# Patient Record
Sex: Male | Born: 1965 | Race: White | Hispanic: No | Marital: Married | Smoking: Never smoker
Health system: Southern US, Community
[De-identification: ages and names within clinical notes are randomized; demographics above are authoritative.]

## PROBLEM LIST (undated history)

## (undated) DIAGNOSIS — K219 Gastro-esophageal reflux disease without esophagitis: Secondary | ICD-10-CM

## (undated) DIAGNOSIS — I10 Essential (primary) hypertension: Secondary | ICD-10-CM

## (undated) DIAGNOSIS — M1990 Unspecified osteoarthritis, unspecified site: Secondary | ICD-10-CM

## (undated) DIAGNOSIS — M519 Unspecified thoracic, thoracolumbar and lumbosacral intervertebral disc disorder: Secondary | ICD-10-CM

## (undated) HISTORY — PX: HAND SURGERY: SHX662

## (undated) HISTORY — PX: VASECTOMY: SHX75

## (undated) HISTORY — DX: Unspecified thoracic, thoracolumbar and lumbosacral intervertebral disc disorder: M51.9

## (undated) HISTORY — DX: Essential (primary) hypertension: I10

## (undated) HISTORY — DX: Gastro-esophageal reflux disease without esophagitis: K21.9

## (undated) HISTORY — DX: Unspecified osteoarthritis, unspecified site: M19.90

## (undated) HISTORY — PX: SHOULDER ARTHROSCOPY: SHX128

## (undated) HISTORY — PX: THORACIC DISC SURGERY: SHX801

---

## 2004-07-19 ENCOUNTER — Emergency Department (HOSPITAL_COMMUNITY): Admission: EM | Admit: 2004-07-19 | Discharge: 2004-07-20 | Payer: Self-pay | Admitting: Emergency Medicine

## 2005-08-27 IMAGING — CR DG HAND COMPLETE 3+V*R*
3 series · 3 of 3 positions shown · non-contrast
Comparison: none

CLINICAL DATA: Injured hand tonight, with swelling.
 RIGHT HAND
 Three views of the right hand were obtained.
 There is a slightly angulated fracture of the right second metacarpal head and neck with the distal portion angulated toward the palm.  No other acute bony abnormality is seen.
 IMPRESSION 
 Comminuted slightly angulated fracture of the right second metacarpal head and neck.

[view not recorded (1 of 3)]
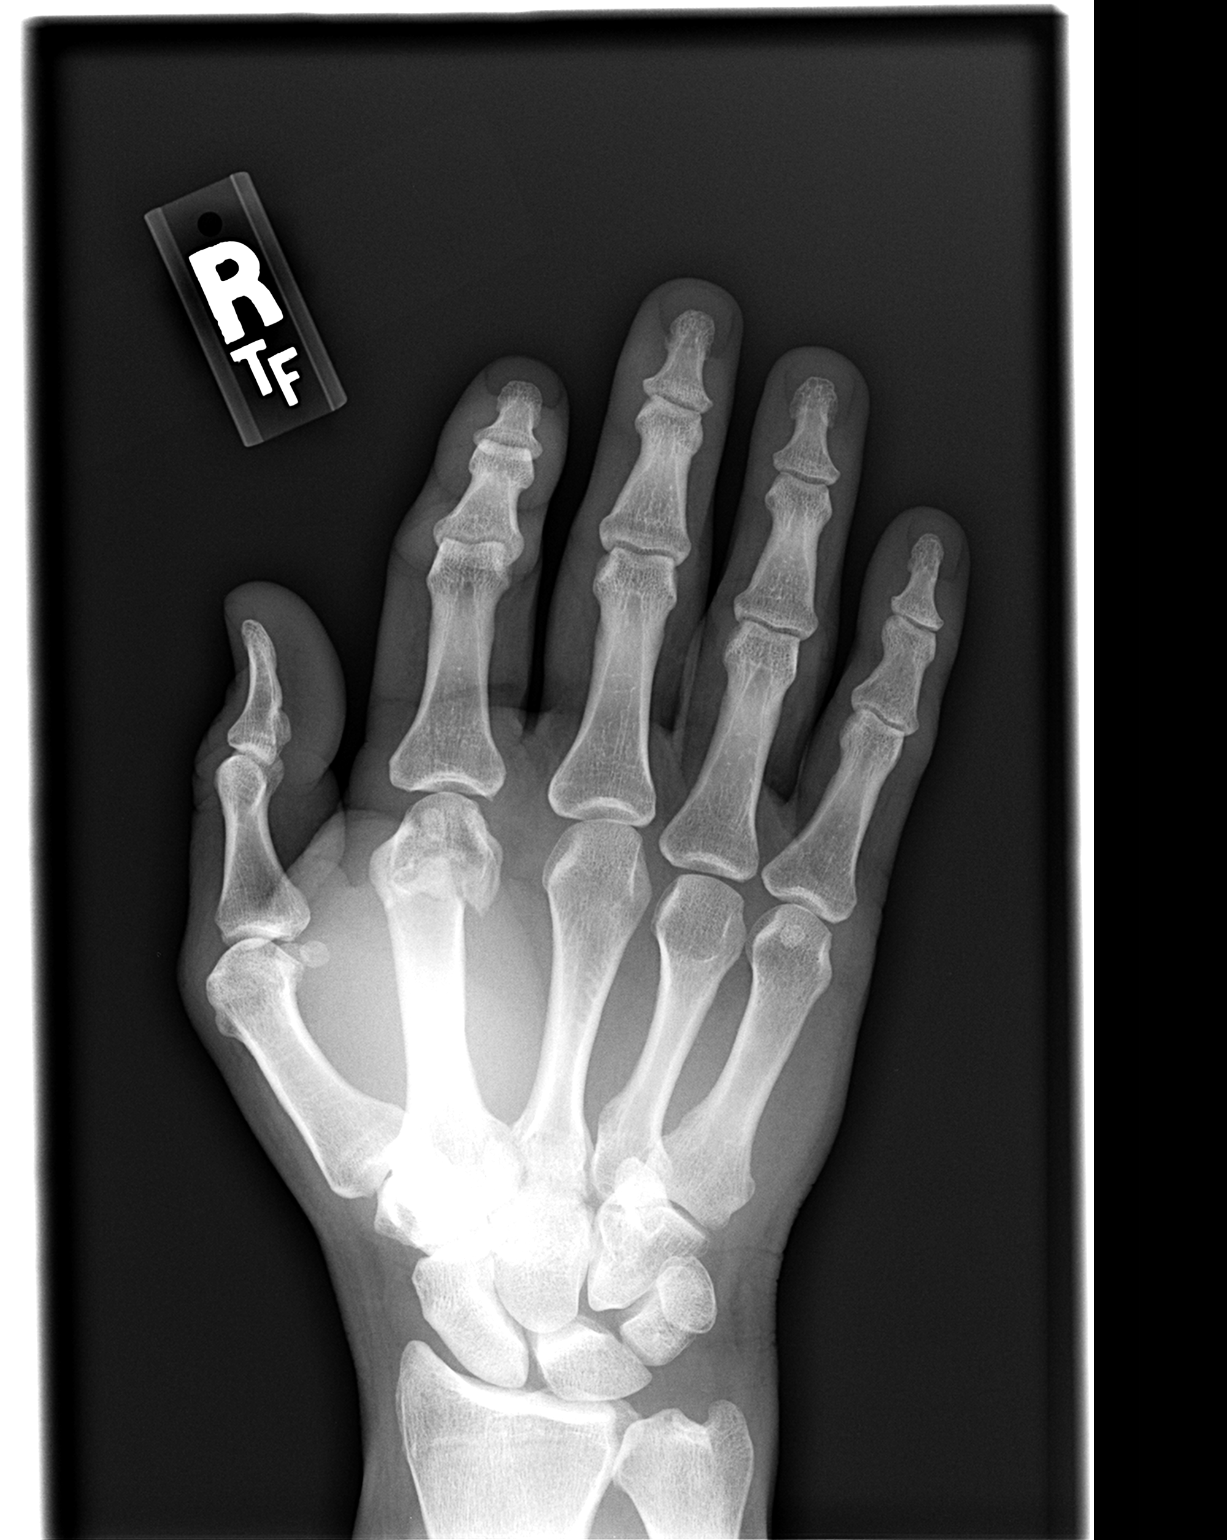

[view not recorded (2 of 3)]
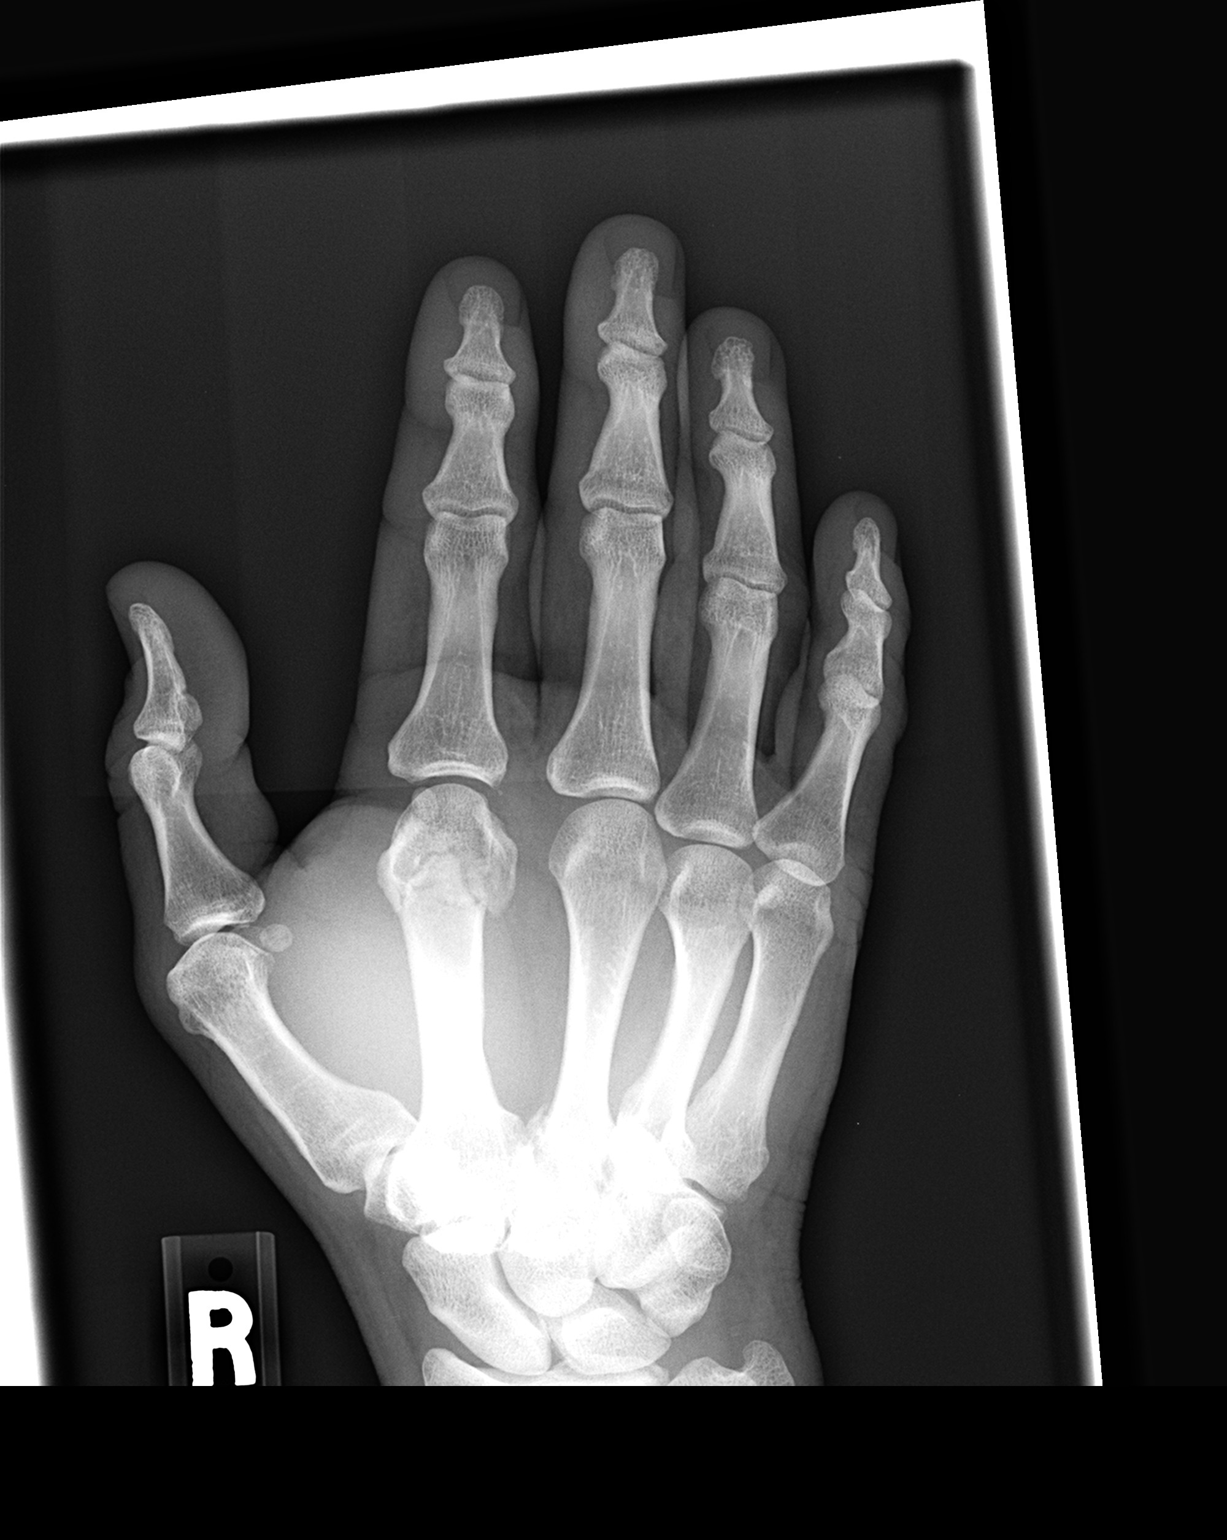

[view not recorded (3 of 3)]
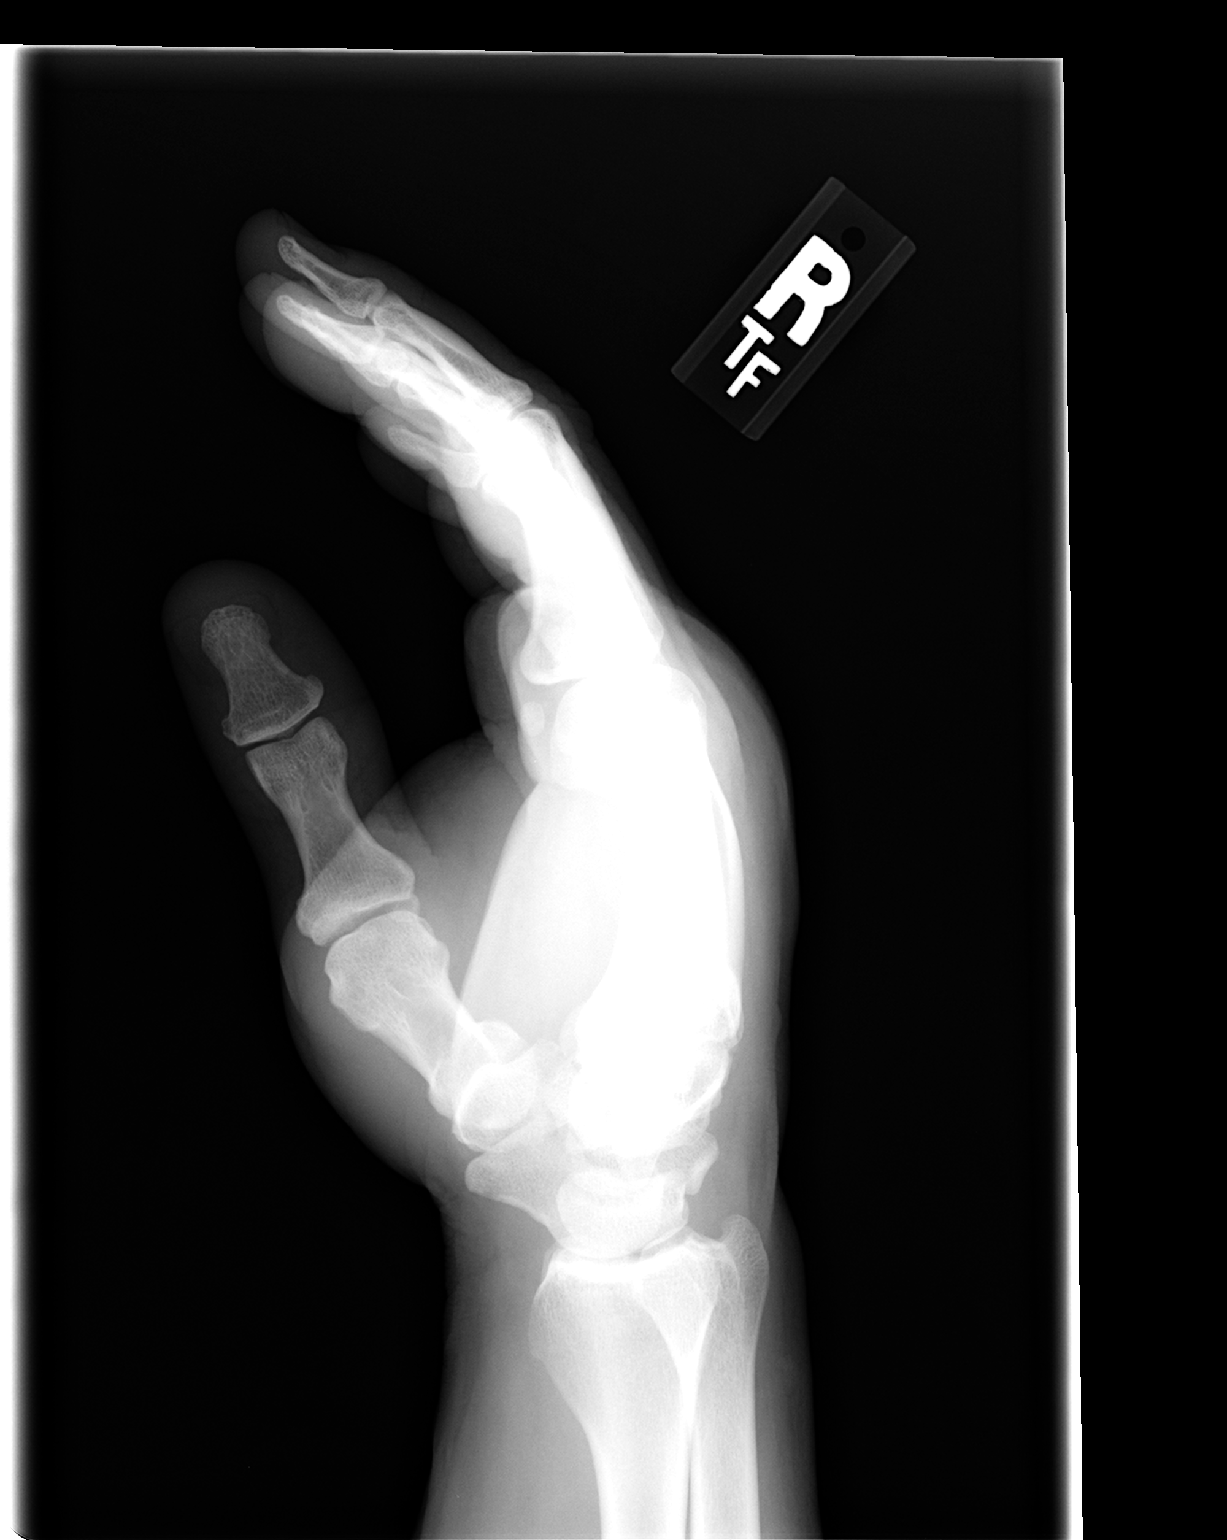

[3 of 3 positions shown; findings below may reference images not displayed]

## 2006-10-28 ENCOUNTER — Emergency Department (HOSPITAL_COMMUNITY): Admission: EM | Admit: 2006-10-28 | Discharge: 2006-10-28 | Payer: Self-pay | Admitting: Emergency Medicine

## 2008-08-30 ENCOUNTER — Encounter: Admission: RE | Admit: 2008-08-30 | Discharge: 2008-10-18 | Payer: Self-pay | Admitting: Orthopedic Surgery

## 2017-01-01 ENCOUNTER — Ambulatory Visit: Payer: Self-pay | Admitting: Cardiovascular Disease

## 2018-10-06 ENCOUNTER — Other Ambulatory Visit: Payer: Self-pay | Admitting: Family Medicine

## 2018-10-06 DIAGNOSIS — M25511 Pain in right shoulder: Secondary | ICD-10-CM

## 2018-10-06 DIAGNOSIS — G8929 Other chronic pain: Secondary | ICD-10-CM

## 2018-10-06 DIAGNOSIS — M546 Pain in thoracic spine: Principal | ICD-10-CM

## 2018-10-21 ENCOUNTER — Ambulatory Visit
Admission: RE | Admit: 2018-10-21 | Discharge: 2018-10-21 | Disposition: A | Discharge status: Home / Self Care | Payer: Commercial Managed Care - PPO | Source: Ambulatory Visit | Attending: Family Medicine | Admitting: Family Medicine

## 2018-10-21 DIAGNOSIS — G8929 Other chronic pain: Secondary | ICD-10-CM

## 2018-10-21 DIAGNOSIS — M546 Pain in thoracic spine: Principal | ICD-10-CM

## 2018-10-21 DIAGNOSIS — M25511 Pain in right shoulder: Secondary | ICD-10-CM

## 2019-11-29 IMAGING — MR MR THORACIC SPINE W/O CM
4 of 5 series · 16 of 48 positions shown · non-contrast
Comparison: Chest CT [HOSPITAL] Kai Ove Rasdal 10/21/2017.

CLINICAL DATA: 52-year-old male with chronic right-sided thoracic
back pain. Pain radiating down the spine to the buttock, hip and
leg.

EXAM:
MRI THORACIC SPINE WITHOUT CONTRAST
TECHNIQUE: Multiplanar, multisequence MR imaging of the thoracic spine was
performed. No intravenous contrast was administered.

[Series 6: STIR · sagittal · 3.5mm · 1.06mm/px · 3 of 12 slices shown]
[im 3/12]
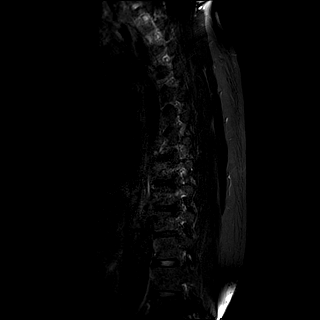
[im 7/12]
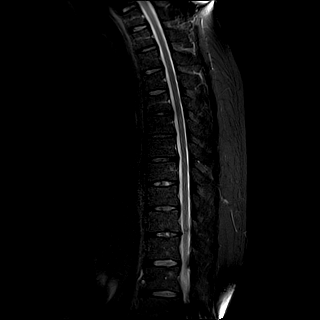
[im 12/12]
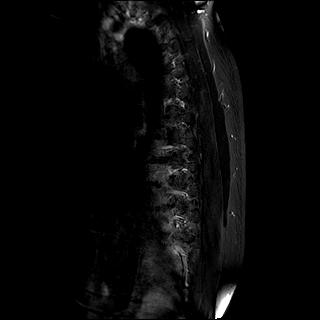

[Series 7: T1 · sagittal · 3.5mm · 0.53mm/px · 3 of 12 slices shown]
[im 1/12]
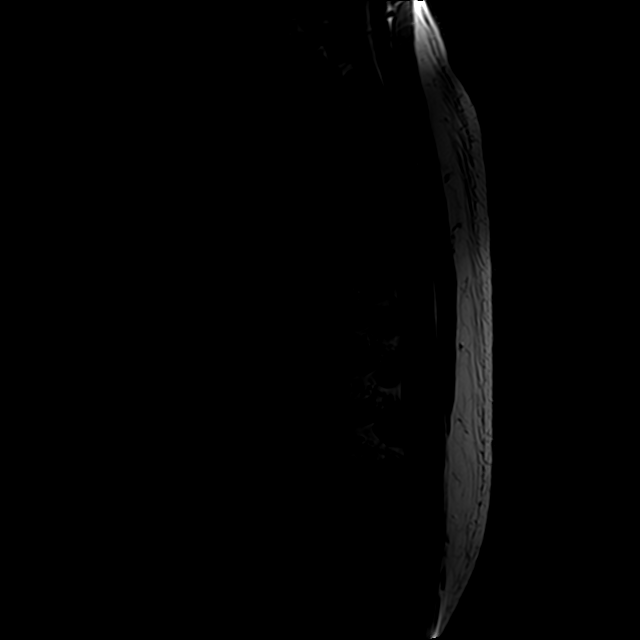
[im 6/12]
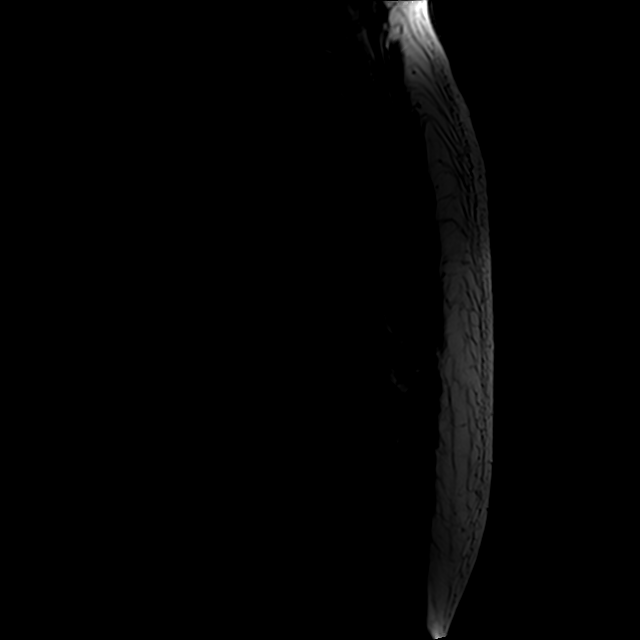
[im 12/12]
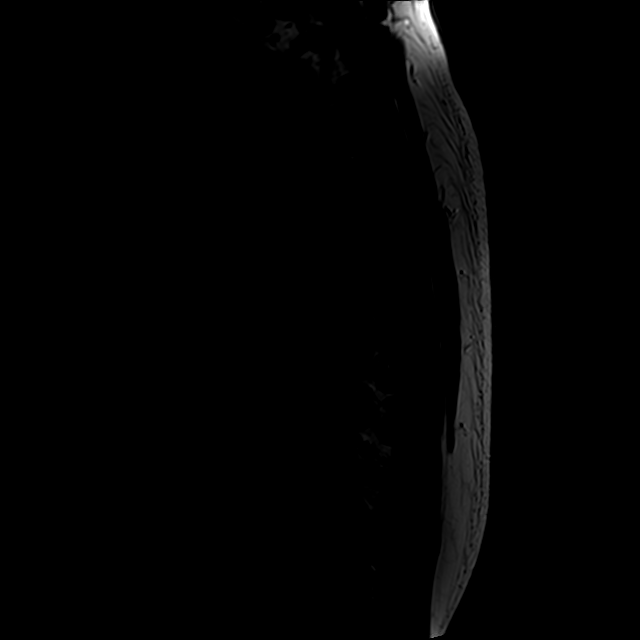

[Series 8: T2 · sagittal · 3.5mm · 0.53mm/px · 5 of 12 slices shown (1 of 2)]
[im 1/12]
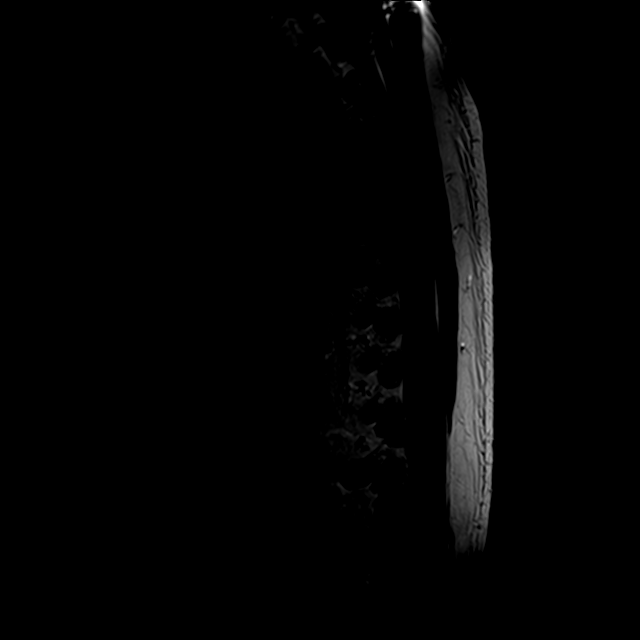
[im 3/12]
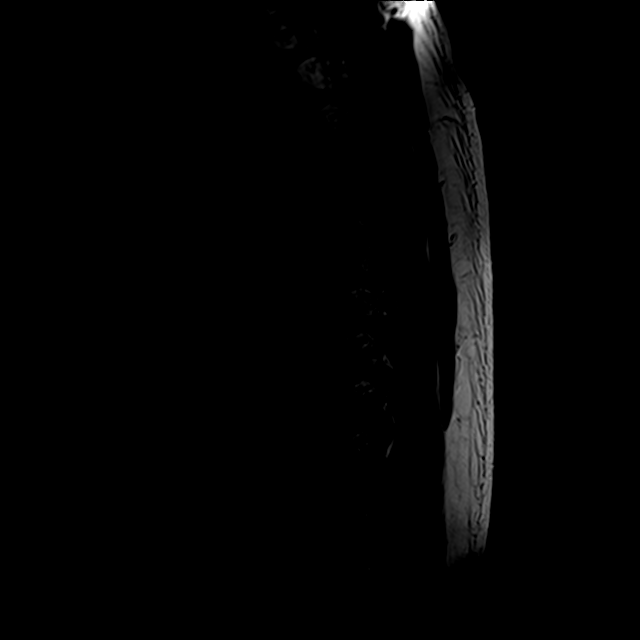
[im 6/12]
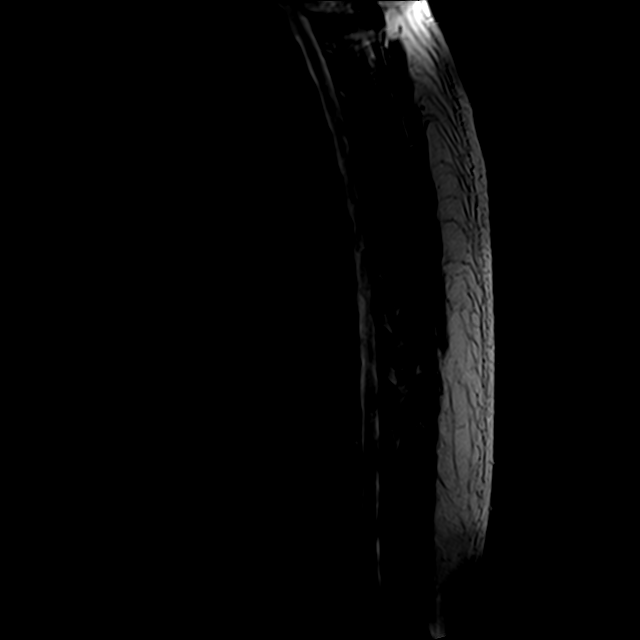
[im 9/12]
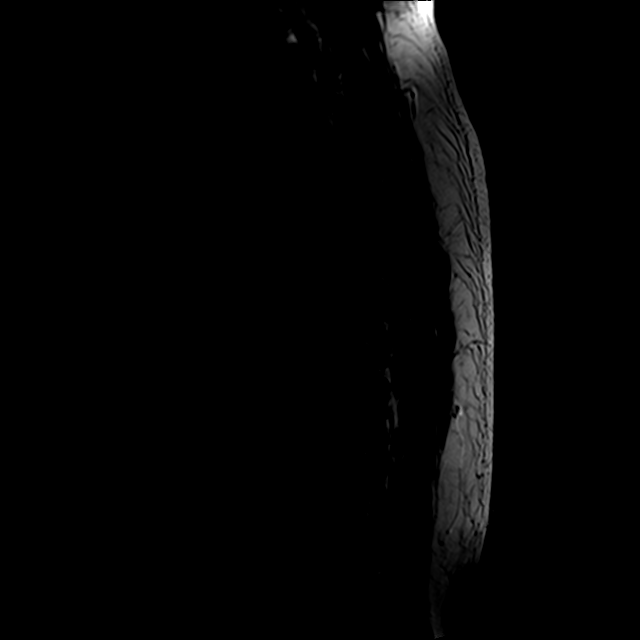
[im 12/12]
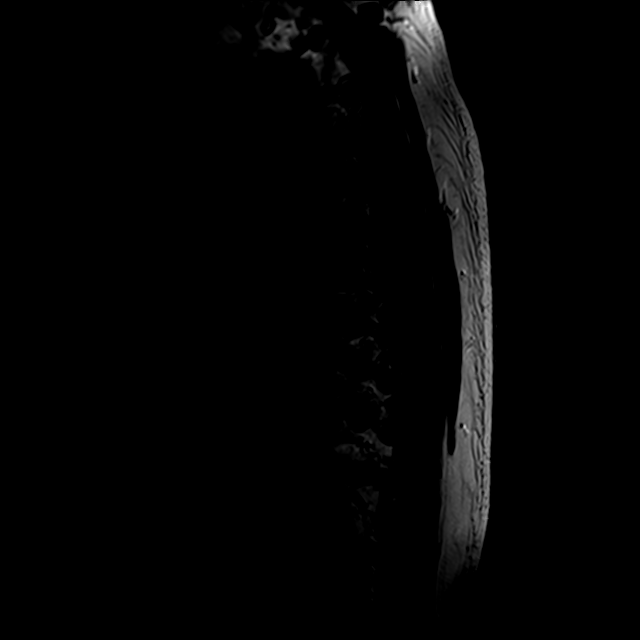

[Series 9: T2 · axial · 4.0mm · 0.39mm/px · z∈[-303,-99]mm · 5 of 36 slices shown (2 of 2)]
[im 3/36]
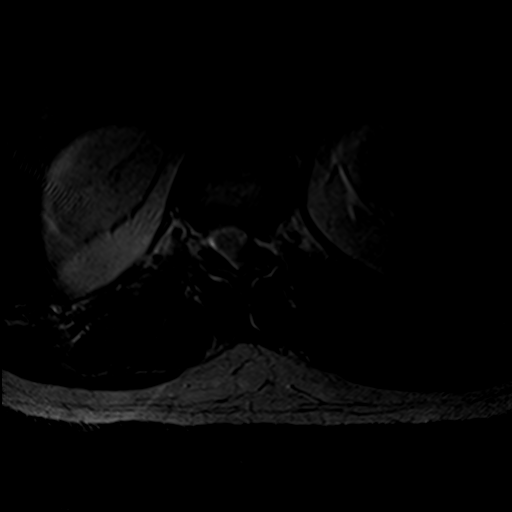
[im 5/36]
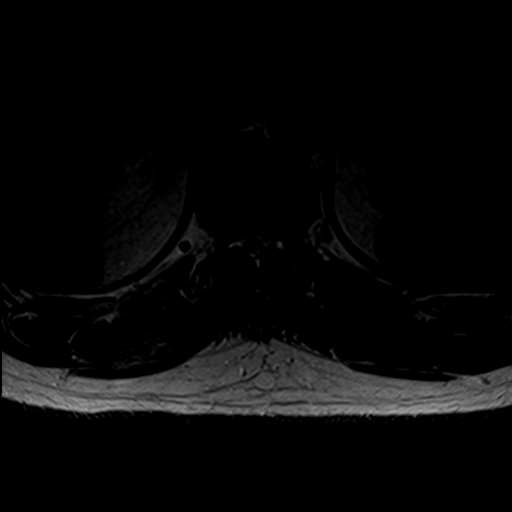
[im 8/36]
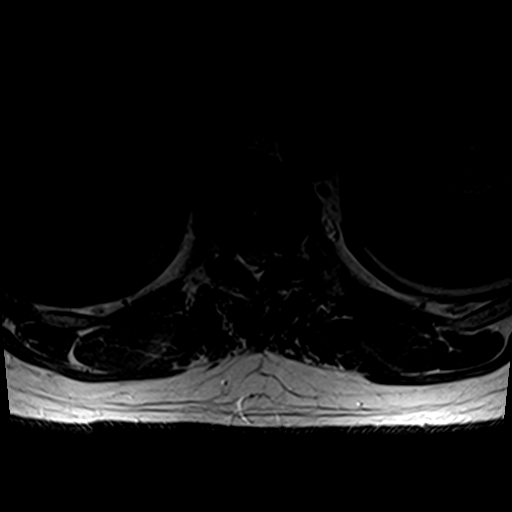
[im 19/36]
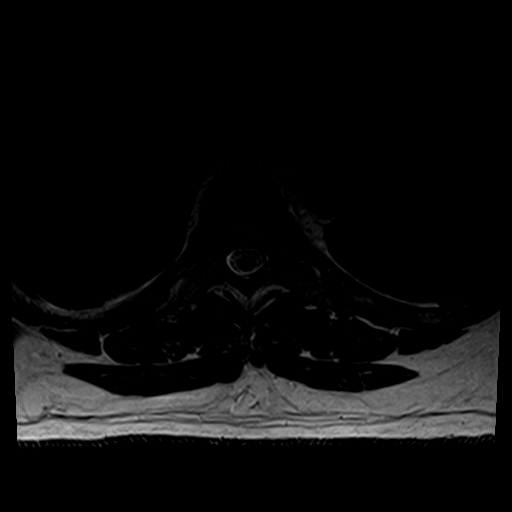
[im 31/36]
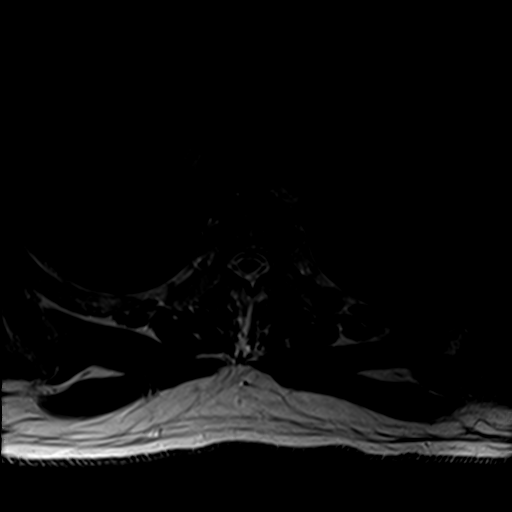

[16 of 48 positions shown; findings below may reference images not displayed]

FINDINGS: Limited cervical spine imaging:  Negative.

Thoracic spine segmentation:  Appears normal.

Alignment: Stable since 3237 with mild straightening of thoracic
kyphosis.

Vertebrae: No marrow edema or evidence of acute osseous abnormality.
A small round sclerotic lesion in the right T4 vertebral body seen
in 3237 has decreased T1 signal, but no marrow edema and is stable
in size. Favor benign etiology. Other visualized bone marrow signal
is within normal limits. There are mild chronic superior endplate
deformities of T4 and T5.

Cord: Spinal cord signal is within normal limits at all visualized
levels. Conus medullaris occurs below T12-L1.

Paraspinal and other soft tissues: Negative.

Disc levels:

T1-T2: Negative.

T2-T3: Negative.

T3-T4: Negative.

T4-T5: Mild to moderate facet hypertrophy. Borderline to mild
bilateral T4 foraminal stenosis. No spinal stenosis.

T5-T6: Mild left disc bulge or small left paracentral disc
protrusion. Moderate right facet hypertrophy. No spinal stenosis.
Moderate right T5 foraminal stenosis.

T6-T7: Negative.

T7-T8: Mild posterior disc bulging. Possible small right paracentral
annular fissure on series 9, image 20. No stenosis.

T8-T9: Negative.

T9-T10: Mild disc bulge. Mild to moderate facet hypertrophy greater
on the left. Moderate to severe left and mild right T9 foraminal
stenosis.

T10-T11: Circumferential disc bulge. Moderate facet hypertrophy.
Mild spinal stenosis. Mild cord mass effect. Moderate to severe
bilateral T10 foraminal stenosis.

T11-T12: Moderate facet and moderate to severe ligament flavum
hypertrophy. Mild spinal stenosis. Mild right T11 foraminal
stenosis.

T12-L1: Negative.
IMPRESSION: 1. No acute osseous abnormality. A small benign bone lesion is
suspected in the right T4 vertebral body and appears stable since a
Chest CT 1 year ago.
2. Multifactorial degenerative lower thoracic spinal stenosis at
T10-T11 and T11-T12. Up to mild spinal cord mass effect but no cord
signal abnormality.
3. Associated degenerative moderate or severe neural foraminal
stenosis at the bilateral T10 nerve levels, and also the right T5
and T9 nerve levels.

## 2022-06-11 DIAGNOSIS — R072 Precordial pain: Secondary | ICD-10-CM | POA: Insufficient documentation

## 2022-06-11 NOTE — Progress Notes (Unsigned)
Cardiology Office Note   Date:  06/12/2022   ID:  Jared Taylor, DOB 19-Nov-1966, MRN [id number]  PCP:  Donetta Potts, MD  Cardiologist:   Rollene Rotunda, MD Referring:  Donetta Potts, MD  Chief Complaint  Patient presents with   Chest Pain      History of Present Illness: Jared Taylor is a 56 y.o. male who was referred by Donetta Potts, MD for evaluation of chest pain.  He was in the emergency room on 06/03/2022 for this.  I reviewed these records for this visit.     He did have a stress test years ago.  His parents have both had heart disease.  He himself does not have any heart disease.  He has had longstanding hypertension that apparently is somewhat difficult to control.  He said he has been getting chest discomfort.  This has been for 5 to 6 weeks.  It happens at rest.  It is 3 out of 10 in intensity.  He points to his mid chest.  It can happen every other day or so for could happen a few times in 1 day.  It is a dull aching discomfort with occasional sharp shooting discomfort.  It does not seem to radiate to his jaw or to his arms.  He cannot bring it on.  He does activities around his [place] acres and the house he has at [facility].  This will not bring on the symptoms.  He is limited somewhat by back and knee pain.  He says it comes and goes more spontaneously the discomfort.  He does not take anything to get rid of it.  It is not exacerbated by movement or breathing.  He is not having any associated shortness of breath.  He does not have PND or orthopnea.  He does not describe nausea or vomiting.  Does not have palpitations, presyncope or syncope.  He is also been getting headaches.  This has been happening episodically and he wonders if it is related to his difficult to control blood pressures.  His blood pressures at home are always in the 140s 160s systolic.  He does not take his blood pressure at the time of these headaches necessarily.  He feels his heart  beating heavy in his chest but he is not describing any tachypalpitations.  Past Medical History:  Diagnosis Date   GERD (gastroesophageal reflux disease)    HTN (hypertension)    Osteoarthritis    Thoracic disc disease     Past Surgical History:  Procedure Laterality Date   HAND SURGERY     SHOULDER ARTHROSCOPY     THORACIC DISC SURGERY     VASECTOMY N/A      Current Outpatient Medications  Medication Sig Dispense Refill   alendronate (FOSAMAX) 70 MG tablet Take 70 mg by mouth once a week.     amLODipine-benazepril (LOTREL) 10-40 MG capsule Take 1 capsule by mouth daily. 90 capsule 1   meloxicam (MOBIC) 7.5 MG tablet Take 7.5 mg by mouth 2 (two) times daily.     methocarbamol (ROBAXIN) 500 MG tablet Take by mouth.     metoprolol tartrate (LOPRESSOR) 100 MG tablet Take one tablet two hours prior to the test 1 tablet 0   Omeprazole 20 MG TBEC Take 20 mg by mouth daily in the afternoon.     rOPINIRole (REQUIP) 1 MG tablet Take 1 mg by mouth as needed.     spironolactone (ALDACTONE) 50 MG tablet  Take 1 tablet (50 mg total) by mouth daily. 90 tablet 1   zolpidem (AMBIEN) 10 MG tablet Take 10 mg by mouth at bedtime as needed.     albuterol (VENTOLIN HFA) 108 (90 Base) MCG/ACT inhaler Inhale into the lungs as needed. (Patient not taking: Reported on 06/12/2022)     sildenafil (REVATIO) 20 MG tablet Take 20 mg by mouth as needed. (Patient not taking: Reported on 06/12/2022)     No current facility-administered medications for this visit.    Allergies:   Patient has no allergy information on record.    Social History:  The patient  reports that he has never smoked. His smokeless tobacco use includes chew.   Family History:  The patient's family history includes Heart disease (age of onset: 62) in his father; Hypertension in his mother; Transient ischemic attack in his mother.    ROS:  Please see the history of present illness.   Otherwise, review of systems are positive for none.    All other systems are reviewed and negative.    PHYSICAL EXAM: VS:  BP 138/86 (BP Location: Left Arm, Patient Position: Sitting, Cuff Size: Large)   Pulse 70   Ht 6' (1.829 m)   Wt 273 lb 3.2 oz (123.9 kg)   SpO2 96%   BMI 37.05 kg/m  , BMI Body mass index is 37.05 kg/m. GENERAL:  Well appearing HEENT:  Pupils equal round and reactive, fundi not visualized, oral mucosa unremarkable NECK:  No jugular venous distention, waveform within normal limits, carotid upstroke brisk and symmetric, no bruits, no thyromegaly LYMPHATICS:  No cervical, inguinal adenopathy LUNGS:  Clear to auscultation bilaterally BACK:  No CVA tenderness CHEST:  Unremarkable HEART:  PMI not displaced or sustained,S1 and S2 within normal limits, no S3, no S4, no clicks, no rubs, no murmurs ABD:  Flat, positive bowel sounds normal in frequency in pitch, no bruits, no rebound, no guarding, no midline pulsatile mass, no hepatomegaly, no splenomegaly EXT:  2 plus pulses throughout, no edema, no cyanosis no clubbing SKIN:  No rashes no nodules NEURO:  Cranial nerves II through XII grossly intact, motor grossly intact throughout PSYCH:  Cognitively intact, oriented to person place and time    EKG:  EKG is ordered today. The ekg ordered today demonstrates sinus rhythm with sinus arrhythmia, axis within normal limits, intervals within normal limits, no acute ST-T wave changes.   Recent Labs: No results found for requested labs within last 365 days.    Lipid Panel No results found for: "CHOL", "TRIG", "HDL", "CHOLHDL", "VLDL", "LDLCALC", "LDLDIRECT"    Wt Readings from Last 3 Encounters:  06/12/22 273 lb 3.2 oz (123.9 kg)      Other studies Reviewed: Additional studies/ records that were reviewed today include: [facility] ED records. Review of the above records demonstrates:  Please see elsewhere in the note.     ASSESSMENT AND PLAN:  Chest pain: The chest pain has nonanginal greater than anginal qualities.   However, he has significant cardiovascular risk factors.  He would not be able to walk on a treadmill for stress test.  Given the moderate pretest probability of obstructive coronary disease I will send him for coronary CTA.  HTN: He has difficult to control blood pressure.  He does not have any overt findings consistent with sleep apnea.  I will consider this however is a secondary cause.  I will also consider potential work-up of renal artery stenosis though I think this is less likely.  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  He is going to need a fourth agent.  For ease of administration I will switch him to Lotrel 10/40 daily.  Because he had hypokalemia likely related to the chlorthalidone I will stop both his potassium and chlorthalidone and add spironolactone 50 mg daily.  He is going to keep a blood pressure diary.  I will check a basic metabolic profile in about a week.    Dyslipidemia: Triglycerides were elevated.  We talked about diet.  Tobacco: He has no intention of quitting his smokeless tobacco.   Current medicines are reviewed at length with the patient today.  The patient does not have concerns regarding medicines.  The following changes have been made:  no change  Labs/ tests ordered today include:   Orders Placed This Encounter  Procedures   CT CORONARY MORPH W/CTA COR W/SCORE W/CA W/CM &/OR WO/CM   Basic metabolic panel   EKG 12-Lead     Disposition:   FU with me in one month.     Signed, Rollene Rotunda, MD  06/12/2022 12:02 PM    [facility]

## 2022-06-12 ENCOUNTER — Ambulatory Visit (INDEPENDENT_AMBULATORY_CARE_PROVIDER_SITE_OTHER): Payer: Commercial Managed Care - PPO | Admitting: Cardiology

## 2022-06-12 ENCOUNTER — Encounter: Payer: Self-pay | Admitting: Cardiology

## 2022-06-12 VITALS — BP 138/86 | HR 70 | Ht 72.0 in | Wt 273.2 lb

## 2022-06-12 DIAGNOSIS — R072 Precordial pain: Secondary | ICD-10-CM | POA: Diagnosis not present

## 2022-06-12 MED ORDER — METOPROLOL TARTRATE 100 MG PO TABS: ORAL_TABLET | ORAL | 0 refills | Status: DC

## 2022-06-12 MED ORDER — AMLODIPINE BESY-BENAZEPRIL HCL 10-40 MG PO CAPS: 1.0000 | ORAL_CAPSULE | Freq: Every day | ORAL | 1 refills | Status: DC

## 2022-06-12 MED ORDER — SPIRONOLACTONE 50 MG PO TABS: 50.0000 mg | ORAL_TABLET | Freq: Every day | ORAL | 1 refills | Status: DC

## 2022-06-12 NOTE — Patient Instructions (Addendum)
Medication Instructions:  STOP the Amlodipine STOP the Potassium STOP the Losartan STOP the Chlorthalidone  START the Lotrel (Amlodipine-Benazepril) 10-40 mg once daily START Spironolactone 50 mg once daily  *If you need a refill on your cardiac medications before your next appointment, please call your pharmacy*   Lab Work: Your provider would like for you to return in 1 week to have the following labs drawn: BMET. You do not need an appointment for the lab. Once in our office lobby there is a podium where you can sign in and ring the doorbell to alert [place] that you are here. The lab is open from 8:00 am to 4 pm; closed for lunch from 12:45pm-1:45pm.  You may also go to any of these [facility] locations:   [facility] - [address] ([facility]) - [address] [phone] [address]   [place] - [address] - [address] [address]   If you have labs (blood work) drawn today and your tests are completely normal, you will receive your results only by: [employer] (if you have MyChart) OR A paper copy in the mail If you have any lab test that is abnormal or we need to change your treatment, we will call you to review the results.  Follow-Up: At [facility], you and your health needs are our priority.  As part of our continuing mission to provide you with exceptional heart care, we have created designated Provider Care Teams.  These Care Teams include your primary Cardiologist (physician) and Advanced Practice Providers (APPs -  Physician Assistants and Nurse Practitioners) who all work together to provide you with the care you need, when you need it.  We recommend signing up for the patient portal called "MyChart".  Sign up information is provided on this After Visit Summary.  MyChart is used to connect with patients for Virtual Visits (Telemedicine).  Patients are able to view lab/test results, encounter notes,  upcoming appointments, etc.  Non-urgent messages can be sent to your provider as well.   To learn more about what you can do with MyChart, go to [email or web address].    Your next appointment:   1 month(s)  The format for your next appointment:   In Person  Provider:   Dr. Antoine Poche  Other Instructions   Your cardiac CT will be scheduled at one of the below locations:   [facility] [address] [place], [place] [address] [phone]  OR  [facility] [address] [place], [place] [address] [phone]  If scheduled at [facility], please arrive at the [facility] and Children's Entrance (Entrance C2) of [facility] 30 minutes prior to test start time. You can use the FREE valet parking offered at entrance C (encouraged to control the heart rate for the test)  Proceed to the [facility] Radiology Department (first floor) to check-in and test prep.  All radiology patients and guests should use entrance C2 at [facility], accessed from [facility], even though the hospital's physical address listed is [address]    If scheduled at [facility], please arrive 15 mins early for check-in and test prep.  Please follow these instructions carefully (unless otherwise directed):  Hold all erectile dysfunction medications at least 3 days (72 hrs) prior to test.  On the Night Before the Test: Be sure to Drink  plenty of water. Do not consume any caffeinated/decaffeinated beverages or chocolate 12 hours prior to your test. Do not take any antihistamines 12 hours prior to your test.  On the Day of the Test: Drink plenty of water until 1 hour prior to the test. Do not eat any food 4 hours prior to the test. You may take your regular medications prior to the test.  Take metoprolol (Lopressor) two hours prior to test. HOLD Spironolactone morning of  the test. FEMALES- please wear underwire-free bra if available, avoid dresses & tight clothing  After the Test: Drink plenty of water. After receiving IV contrast, you may experience a mild flushed feeling. This is normal. On occasion, you may experience a mild rash up to 24 hours after the test. This is not dangerous. If this occurs, you can take Benadryl 25 mg and increase your fluid intake. If you experience trouble breathing, this can be serious. If it is severe call 911 IMMEDIATELY. If it is mild, please call our office. If you take any of these medications: Glipizide/Metformin, Avandament, Glucavance, please do not take 48 hours after completing test unless otherwise instructed.  We will call to schedule your test 2-4 weeks out understanding that some insurance companies will need an authorization prior to the service being performed.   For non-scheduling related questions, please contact the cardiac imaging nurse navigator should you have any questions/concerns: Rockwell Alexandria, Cardiac Imaging Nurse Navigator Larey Brick, Cardiac Imaging Nurse Navigator [facility] Direct Office Dial: [phone]   For scheduling needs, including cancellations and rescheduling, please call [place], [phone].

## 2022-06-23 ENCOUNTER — Ambulatory Visit: Payer: Commercial Managed Care - PPO | Admitting: Psychiatry

## 2022-07-02 ENCOUNTER — Telehealth (HOSPITAL_COMMUNITY): Payer: Self-pay | Admitting: *Deleted

## 2022-07-02 NOTE — Telephone Encounter (Signed)
Reaching out to patient to offer assistance regarding upcoming cardiac imaging study; pt verbalizes understanding of appt date/time, parking situation and where to check in, pre-test NPO status and medications ordered, and verified current allergies; name and call back number provided for further questions should they arise  Larey Brick RN Navigator Cardiac Imaging [facility] [phone] office [phone] cell  Patient to take 100mg  metoprolol tartrate two hours prior to his cardiac CT scan. He denies recent use of revatio and is aware to arrive at 3pm.

## 2022-07-02 NOTE — Telephone Encounter (Signed)
Attempted to call patient regarding upcoming cardiac CT appointment. °Left message on voicemail with name and callback number ° °Earlin Sweeden RN Navigator Cardiac Imaging °[facility] °[phone] Office °[phone] Cell ° °

## 2022-07-03 ENCOUNTER — Ambulatory Visit (HOSPITAL_COMMUNITY)
Admission: RE | Admit: 2022-07-03 | Discharge: 2022-07-03 | Disposition: A | Discharge status: Home / Self Care | Payer: Commercial Managed Care - PPO | Source: Ambulatory Visit | Attending: Cardiology | Admitting: Cardiology

## 2022-07-03 DIAGNOSIS — R072 Precordial pain: Secondary | ICD-10-CM | POA: Insufficient documentation

## 2022-07-03 MED ORDER — NITROGLYCERIN 0.4 MG SL SUBL
SUBLINGUAL_TABLET | SUBLINGUAL | Status: AC
  Filled 2022-07-03: qty 2

## 2022-07-03 MED ORDER — IOHEXOL 350 MG/ML SOLN
100.0000 mL | Freq: Once | INTRAVENOUS | Status: AC | PRN
Start: 2022-07-03 — End: 2022-07-03
  Administered 2022-07-03: 100 mL via INTRAVENOUS

## 2022-07-03 MED ORDER — NITROGLYCERIN 0.4 MG SL SUBL
0.8000 mg | SUBLINGUAL_TABLET | Freq: Once | SUBLINGUAL | Status: AC
  Administered 2022-07-03: 0.8 mg via SUBLINGUAL

## 2022-07-18 ENCOUNTER — Ambulatory Visit: Payer: Commercial Managed Care - PPO | Admitting: Cardiology

## 2022-07-28 DIAGNOSIS — I10 Essential (primary) hypertension: Secondary | ICD-10-CM | POA: Insufficient documentation

## 2022-07-28 DIAGNOSIS — E785 Hyperlipidemia, unspecified: Secondary | ICD-10-CM | POA: Insufficient documentation

## 2022-07-28 DIAGNOSIS — Z720 Tobacco use: Secondary | ICD-10-CM | POA: Insufficient documentation

## 2022-07-28 NOTE — Progress Notes (Unsigned)
Cardiology Office Note   Date:  07/30/2022   ID:  Jared Taylor, DOB [birth year], MRN [id number]  PCP:  Donetta Potts, MD  Cardiologist:   Rollene Rotunda, MD Referring:  Donetta Potts, MD  Chief Complaint  Patient presents with   Chest Pain      History of Present Illness: Jared Taylor is a 56 y.o. male who was referred by Donetta Potts, MD for evaluation of chest pain.  He had 25 - 49% LM stenosis.   We got rid of his beta-blocker as he was having multiple complaints including headaches and his blood pressure was not well controlled.  He has been on spironolactone and Lotrel.  He says he is feeling much better.  His headaches have resolved. The patient denies any new symptoms such as chest discomfort, neck or arm discomfort. There has been no new shortness of breath, PND or orthopnea. There have been no reported palpitations, presyncope or syncope.    Past Medical History:  Diagnosis Date   GERD (gastroesophageal reflux disease)    HTN (hypertension)    Osteoarthritis    Thoracic disc disease     Past Surgical History:  Procedure Laterality Date   HAND SURGERY     SHOULDER ARTHROSCOPY     THORACIC DISC SURGERY     VASECTOMY N/A      Current Outpatient Medications  Medication Sig Dispense Refill   albuterol (VENTOLIN HFA) 108 (90 Base) MCG/ACT inhaler Inhale into the lungs as needed.     amLODipine-benazepril (LOTREL) 10-40 MG capsule Take 1 capsule by mouth daily. 90 capsule 1   Omeprazole 20 MG TBEC Take 20 mg by mouth daily in the afternoon.     rOPINIRole (REQUIP) 1 MG tablet Take 1 mg by mouth as needed.     rosuvastatin (CRESTOR) 10 MG tablet Take 10 mg by mouth at bedtime.     spironolactone (ALDACTONE) 50 MG tablet Take 1 tablet (50 mg total) by mouth daily. 90 tablet 1   zolpidem (AMBIEN) 10 MG tablet Take 10 mg by mouth at bedtime as needed.     alendronate (FOSAMAX) 70 MG tablet Take 70 mg by mouth once a week. (Patient not taking:  Reported on 07/30/2022)     sildenafil (REVATIO) 20 MG tablet Take 20 mg by mouth as needed. (Patient not taking: Reported on 06/12/2022)     No current facility-administered medications for this visit.    Allergies:   Amoxicillin and Lisinopril   ROS:  Please see the history of present illness.   Otherwise, review of systems are positive for none.   All other systems are reviewed and negative.    PHYSICAL EXAM: VS:  BP 119/76   Pulse 86   Ht 6' (1.829 m)   Wt 271 lb 3.2 oz (123 kg)   SpO2 96%   BMI 36.78 kg/m  , BMI Body mass index is 36.78 kg/m. GENERAL:  Well appearing NECK:  No jugular venous distention, waveform within normal limits, carotid upstroke brisk and symmetric, no bruits, no thyromegaly LUNGS:  Clear to auscultation bilaterally CHEST:  Unremarkable HEART:  PMI not displaced or sustained,S1 and S2 within normal limits, no S3, no S4, no clicks, no rubs, no murmurs ABD:  Flat, positive bowel sounds normal in frequency in pitch, no bruits, no rebound, no guarding, no midline pulsatile mass, no hepatomegaly, no splenomegaly EXT:  2 plus pulses throughout, no edema, no cyanosis no clubbing   EKG:  EKG  is not ordered today.  Recent Labs: No results found for requested labs within last 365 days.    Lipid Panel No results found for: "CHOL", "TRIG", "HDL", "CHOLHDL", "VLDL", "LDLCALC", "LDLDIRECT"    Wt Readings from Last 3 Encounters:  07/30/22 271 lb 3.2 oz (123 kg)  06/12/22 273 lb 3.2 oz (123.9 kg)      Other studies Reviewed: Additional studies/ records that were reviewed today include: [facility] ED records. Review of the above records demonstrates:  Please see elsewhere in the note.     ASSESSMENT AND PLAN:  Nonobstructive coronary disease:   He is having no further chest pain.  No change in therapy.  He has nonobstructive coronary disease.  We are pursuing aggressive risk reduction.   HTN:    His blood pressures are well controlled.  No change in  therapy.  Dyslipidemia: Triglycerides were 300 with an LDL of 113.  He was recently started on Crestor and he will have this followed again in November and I put these orders in.   Tobacco:   We previously talked about it but he does not plan to stop using smokeless tobacco.    Current medicines are reviewed at length with the patient today.   The following changes have been made:   None  Labs/ tests ordered today include:   Orders Placed This Encounter  Procedures   Lipid panel     Disposition:   FU with me as needed   Signed, Rollene Rotunda, MD  07/30/2022 8:48 AM    [facility]

## 2022-07-30 ENCOUNTER — Ambulatory Visit (INDEPENDENT_AMBULATORY_CARE_PROVIDER_SITE_OTHER): Payer: Commercial Managed Care - PPO | Admitting: Cardiology

## 2022-07-30 ENCOUNTER — Encounter: Payer: Self-pay | Admitting: Cardiology

## 2022-07-30 VITALS — BP 119/76 | HR 86 | Ht 72.0 in | Wt 271.2 lb

## 2022-07-30 DIAGNOSIS — R072 Precordial pain: Secondary | ICD-10-CM | POA: Diagnosis not present

## 2022-07-30 DIAGNOSIS — I10 Essential (primary) hypertension: Secondary | ICD-10-CM | POA: Diagnosis not present

## 2022-07-30 DIAGNOSIS — Z720 Tobacco use: Secondary | ICD-10-CM

## 2022-07-30 DIAGNOSIS — E785 Hyperlipidemia, unspecified: Secondary | ICD-10-CM | POA: Diagnosis not present

## 2022-07-30 NOTE — Patient Instructions (Addendum)
Medication Instructions:  No changes      Lab Work: Lipid - fasting   If you have labs (blood work) drawn today and your tests are completely normal, you will receive your results only by: MyChart Message (if you have MyChart) OR A paper copy in the mail If you have any lab test that is abnormal or we need to change your treatment, we will call you to review the results.   Testing/Procedures:  Not needed  Follow-Up: At [facility], you and your health needs are our priority.  As part of our continuing mission to provide you with exceptional heart care, we have created designated Provider Care Teams.  These Care Teams include your primary Cardiologist (physician) and Advanced Practice Providers (APPs -  Physician Assistants and Nurse Practitioners) who all work together to provide you with the care you need, when you need it.     Your next appointment:     As needed  The format for your next appointment:   In Person  Provider:   Rollene Rotunda, MD    Other Instructions

## 2022-09-01 ENCOUNTER — Other Ambulatory Visit: Payer: Self-pay | Admitting: Cardiology

## 2023-12-17 NOTE — Progress Notes (Deleted)
  Cardiology Office Note:   Date:  12/17/2023  ID:  Jared Taylor, DOB [birth year], MRN [id number] PCP: Jared Puzio FALCON, MD  [facility] Providers Cardiologist:  Lynwood Schilling, MD {  History of Present Illness:   Jared Taylor is a 58 y.o. male who was referred by Jared Millstein FALCON, MD for evaluation of chest pain.  He had 25 - 49% LM stenosis.   ***  ***  We got rid of his beta-blocker as he was having multiple complaints including headaches and his blood pressure was not well controlled.  He has been on spironolactone  and Lotrel.  He says he is feeling much better.  His headaches have resolved. The patient denies any new symptoms such as chest discomfort, neck or arm discomfort. There has been no new shortness of breath, PND or orthopnea. There have been no reported palpitations, presyncope or syncope.   ROS: ***  Studies Reviewed:    EKG:       ***  Risk Assessment/Calculations:   {Does this patient have ATRIAL FIBRILLATION?:[phone]} No BP recorded.  {Refresh Note OR Click here to enter BP  :1}***        Physical Exam:   VS:  There were no vitals taken for this visit.   Wt Readings from Last 3 Encounters:  07/30/22 271 lb 3.2 oz (123 kg)  06/12/22 273 lb 3.2 oz (123.9 kg)     GEN: Well nourished, well developed in no acute distress NECK: No JVD; No carotid bruits CARDIAC: ***RR, *** murmurs, rubs, gallops RESPIRATORY:  Clear to auscultation without rales, wheezing or rhonchi  ABDOMEN: Soft, non-tender, non-distended EXTREMITIES:  No edema; No deformity   ASSESSMENT AND PLAN:   Nonobstructive coronary disease:   ***  He is having no further chest pain.  No change in therapy.  He has nonobstructive coronary disease.  We are pursuing aggressive risk reduction.    HTN:    His blood pressures are *** well controlled.  No change in therapy.   Dyslipidemia: Triglycerides were *** 300 with an LDL of 113.  He was recently started on Crestor  and he will have this  followed again in November and I put these orders in.    Tobacco:   ***  We previously talked about it but he does not plan to stop using smokeless tobacco.      Follow up ***  Signed, Lynwood Schilling, MD

## 2023-12-18 ENCOUNTER — Ambulatory Visit: Payer: Commercial Managed Care - PPO | Attending: Cardiology | Admitting: Cardiology

## 2023-12-18 DIAGNOSIS — Z720 Tobacco use: Secondary | ICD-10-CM

## 2023-12-18 DIAGNOSIS — I10 Essential (primary) hypertension: Secondary | ICD-10-CM

## 2023-12-18 DIAGNOSIS — I2510 Atherosclerotic heart disease of native coronary artery without angina pectoris: Secondary | ICD-10-CM

## 2023-12-18 DIAGNOSIS — E785 Hyperlipidemia, unspecified: Secondary | ICD-10-CM

## 2024-02-22 NOTE — Progress Notes (Addendum)
 Cardiology Office Note:    Date:  03/02/2024   ID:  Jared Taylor, DOB 27-May-1966, MRN [id number]  PCP:  Donetta Potts, MD   [facility] Providers Cardiologist:  Rollene Rotunda, MD Cardiology APP:  Marcelino Duster, PA     Referring MD: Donetta Potts, MD   Chief Complaint  Patient presents with   Follow-up    CAD    History of Present Illness:    Jared Taylor is a 58 y.o. male with a hx of nonobstructive CAD, hyperlipidemia, nicotine abuse, and hypertension.  He underwent coronary CTA 06/2022 that showed 25-49% left main stenosis. He did not tolerate beta-blocker due to headaches and hypertension.  He has done well on amlodipine-benazepril, spironolactone, 10 mg Crestor.  He presents today for 2-year follow-up.  He had side effects with spironolactone including breast enlargement, tearful events - all resolved when he stopped spironolactone. He is experiencing elevated BP in the evenings.   He continues to take 10 mg crestor.  He denies chest pain or shortness of breath, but does have daily palpitations with rapid heartbeat extending several minutes.  He has not had syncope or near syncope.  He continues to remain active and goes to [place] and [place] for fishing.  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   Past Medical History:  Diagnosis Date   GERD (gastroesophageal reflux disease)    HTN (hypertension)    Osteoarthritis    Thoracic disc disease     Past Surgical History:  Procedure Laterality Date   HAND SURGERY     SHOULDER ARTHROSCOPY     THORACIC DISC SURGERY     VASECTOMY N/A     Current Medications: Current Meds  Medication Sig   albuterol (VENTOLIN HFA) 108 (90 Base) MCG/ACT inhaler Inhale into the lungs as needed.   alendronate (FOSAMAX) 70 MG tablet Take 70 mg by mouth once a week.   amLODipine (NORVASC) 10 MG tablet Take 10 mg by mouth daily.   olmesartan (BENICAR) 40 MG tablet  Take 1 tablet (40 mg total) by mouth daily.   Omeprazole 20 MG TBEC Take 20 mg by mouth daily in the afternoon.   rOPINIRole (REQUIP) 1 MG tablet Take 1 mg by mouth as needed.   rosuvastatin (CRESTOR) 20 MG tablet Take 1 tablet (20 mg total) by mouth daily.   sildenafil (REVATIO) 20 MG tablet Take 20 mg by mouth as needed.   zolpidem (AMBIEN) 10 MG tablet Take 10 mg by mouth at bedtime as needed.   [DISCONTINUED] rosuvastatin (CRESTOR) 10 MG tablet Take 20 mg by mouth at bedtime.   [DISCONTINUED] valsartan (DIOVAN) 160 MG tablet Take 160 mg by mouth daily.     Allergies:   Amoxicillin and Lisinopril   Social History   Socioeconomic History   Marital status: Married    Spouse name: Not on file   Number of children: Not on file   Years of education: Not on file   Highest education level: Not on file  Occupational History   Not on file  Tobacco Use   Smoking status: Never   Smokeless tobacco: Current    Types: Chew  Substance and Sexual Activity   Alcohol use: Not on file   Drug use: Not on file   Sexual activity: Not on file  Other Topics Concern   Not on file  Social History Narrative   Lives with wife.  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     Social Drivers of Corporate investment banker Strain: Not on file  Food Insecurity: Not on file  Transportation Needs: Not on file  Physical Activity: Not on file  Stress: Not on file  Social Connections: Not on file     Family History: The patient's family history includes Heart disease (age of onset: 51) in his father; Hypertension in his mother; Transient ischemic attack in his mother.  ROS:   Please see the history of present illness.     All other systems reviewed and are negative.  EKGs/Labs/Other Studies Reviewed:    The following studies were reviewed today:  EKG Interpretation Date/Time:  Wednesday March 02 2024 08:03:18 EDT Ventricular Rate:  78 PR Interval:  152 QRS Duration:  86 QT Interval:  398 QTC  Calculation: 453 R Axis:   1  Text Interpretation: Normal sinus rhythm Nonspecific T wave abnormality No previous ECGs available Confirmed by Micah Flesher ([id number]) on 03/02/2024 8:22:11 AM    Recent Labs: No results found for requested labs within last 365 days.  Recent Lipid Panel No results found for: "CHOL", "TRIG", "HDL", "CHOLHDL", "VLDL", "LDLCALC", "LDLDIRECT"   Risk Assessment/Calculations:                Physical Exam:    VS:  BP 124/78 (Cuff Size: Large)   Pulse 78   Ht 6' (1.829 m)   Wt 262 lb (118.8 kg)   SpO2 98%   BMI 35.53 kg/m     Wt Readings from Last 3 Encounters:  03/02/24 262 lb (118.8 kg)  07/30/22 271 lb 3.2 oz (123 kg)  06/12/22 273 lb 3.2 oz (123.9 kg)     GEN:  Well nourished, well developed in no acute distress HEENT: Normal NECK: No JVD; No carotid bruits LYMPHATICS: No lymphadenopathy CARDIAC: RRR, no murmurs, rubs, gallops RESPIRATORY:  Clear to auscultation without rales, wheezing or rhonchi  ABDOMEN: Soft, non-tender, non-distended MUSCULOSKELETAL:  No edema; No deformity  SKIN: Warm and dry NEUROLOGIC:  Alert and oriented x 3 PSYCHIATRIC:  Normal affect   ASSESSMENT:    1. Essential hypertension   2. Palpitations   3. Coronary artery disease involving native coronary artery of native heart without angina pectoris   4. Primary hypertension   5. Hyperlipidemia with target LDL less than 70    PLAN:    In order of problems listed above:  Nonobstructive CAD - Continue with risk factor management - he is not on aspirin -No chest pain, continue risk factor modification   Palpitations - He reports rapid heartbeats daily - Discussed that we would like to rule out atrial arrhythmia that could increase stroke - He is agreeable to wear a 14-day Zio patch   Hypertension -he did not tolerate spironolactone due to side effects of unilateral breast enlargement and emotional lability - He is doing okay on 160 mg valsartan and 10 mg  amlodipine - Unfortunately he is experiencing elevated blood pressures in the afternoon - Given longer half-life, will switch valsartan to 10 mg olmesartan and move amlodipine to nighttime administration   Hyperlipidemia with LDL goal less than 70 Continue 10 mg Crestor LDL was 96 in 02/2024 Increase crestor to 20 mg Total cholesterol: 163 Triglycerides: 165 HDL: 38 LDL: 96   Return in 1 year with Dr. Antoine Poche or myself.             Medication Adjustments/Labs and Tests Ordered: Current medicines are reviewed at length with the  patient today.  Concerns regarding medicines are outlined above.  Orders Placed This Encounter  Procedures   LONG TERM MONITOR (3-14 DAYS)   EKG 12-Lead   Meds ordered this encounter  Medications   olmesartan (BENICAR) 40 MG tablet    Sig: Take 1 tablet (40 mg total) by mouth daily.    Dispense:  90 tablet    Refill:  3   rosuvastatin (CRESTOR) 20 MG tablet    Sig: Take 1 tablet (20 mg total) by mouth daily.    Dispense:  90 tablet    Refill:  3    Patient Instructions  Medication Instructions:  Stop Valsartan as directed Start Olmesartan 40 mg daily Amlodipine 10 is to be taken at night Increase Crestor 20 mg daily *If you need a refill on your cardiac medications before your next appointment, please call your pharmacy*   Lab Work: NONE ordered at this time of appointment   Testing/Procedures: Christena Deem- Long Term Monitor Instructions  Your physician has requested you wear a ZIO patch monitor for 14 days.  This is a single patch monitor. [employer] supplies one patch monitor per enrollment. Additional stickers are not available. Please do not apply patch if you will be having a Nuclear Stress Test,  Echocardiogram, Cardiac CT, MRI, or Chest Xray during the period you would be wearing the  monitor. The patch cannot be worn during these tests. You cannot remove and re-apply the  ZIO XT patch monitor.  Your ZIO patch monitor will be  mailed 3 day USPS to your address on file. It may take 3-5 days  to receive your monitor after you have been enrolled.  Once you have received your monitor, please review the enclosed instructions. Your monitor  has already been registered assigning a specific monitor serial # to you.  Billing and Patient Assistance Program Information  We have supplied [employer] with any of your insurance information on file for billing purposes. [employer] offers a sliding scale Patient Assistance Program for patients that do not have  insurance, or whose insurance does not completely cover the cost of the ZIO monitor.  You must apply for the Patient Assistance Program to qualify for this discounted rate.  To apply, please call [employer] at [phone], select option 4, select option 2, ask to apply for  Patient Assistance Program. Meredeth Ide will ask your household income, and how many people  are in your household. They will quote your out-of-pocket cost based on that information.  [employer] will also be able to set up a 58-month, interest-free payment plan if needed.  Applying the monitor   Shave hair from upper left chest.  Hold abrader disc by orange tab. Rub abrader in 40 strokes over the upper left chest as  indicated in your monitor instructions.  Clean area with 4 enclosed alcohol pads. Let dry.  Apply patch as indicated in monitor instructions. Patch will be placed under collarbone on left  side of chest with arrow pointing upward.  Rub patch adhesive wings for 2 minutes. Remove white label marked "1". Remove the white  label marked "2". Rub patch adhesive wings for 2 additional minutes.  While looking in a mirror, press and release button in center of patch. A small green light will  flash 3-4 times. This will be your only indicator that the monitor has been turned on.  Do not shower for the first 24 hours. You may shower after the first 24 hours.  Press the button if you feel  a symptom. You will hear  a small click. Record Date, Time and  Symptom in the Patient Logbook.  When you are ready to remove the patch, follow instructions on the last 2 pages of Patient  Logbook. Stick patch monitor onto the last page of Patient Logbook.  Place Patient Logbook in the blue and white box. Use locking tab on box and tape box closed  securely. The blue and white box has prepaid postage on it. Please place it in the mailbox as  soon as possible. Your physician should have your test results approximately 7 days after the  monitor has been mailed back to [facility].  Call [facility] Customer Care at [phone] if you have questions regarding  your ZIO XT patch monitor. Call them immediately if you see an orange light blinking on your  monitor.  If your monitor falls off in less than 4 days, contact our Monitor department at [phone].  If your monitor becomes loose or falls off after 4 days call [employer] at [phone] for  suggestions on securing your monitor     Follow-Up: At [facility], you and your health needs are our priority.  As part of our continuing mission to provide you with exceptional heart care, we have created designated Provider Care Teams.  These Care Teams include your primary Cardiologist (physician) and Advanced Practice Providers (APPs -  Physician Assistants and Nurse Practitioners) who all work together to provide you with the care you need, when you need it.  We recommend signing up for the patient portal called "MyChart".  Sign up information is provided on this After Visit Summary.  MyChart is used to connect with patients for Virtual Visits (Telemedicine).  Patients are able to view lab/test results, encounter notes, upcoming appointments, etc.  Non-urgent messages can be sent to your provider as well.   To learn more about what you can do with MyChart, go to [email or web address].    Your next appointment:   1 year(s)  Provider:   Rollene Rotunda,  MD  or Micah Flesher, PA-C        Other Instructions   1st Floor: - Lobby - Registration  - Pharmacy  - Lab - Cafe  2nd Floor: - PV Lab - Diagnostic Testing (echo, CT, nuclear med)  3rd Floor: - Vacant  4th Floor: - TCTS (cardiothoracic surgery) - AFib Clinic - Structural Heart Clinic - Vascular Surgery  - Vascular Ultrasound  5th Floor: - HeartCare Cardiology (general and EP) - Clinical Pharmacy for coumadin, hypertension, lipid, weight-loss medications, and med management appointments    Valet parking services will be available as well.         Signed, Marcelino Duster, [place]  03/02/2024 8:57 AM    [facility]

## 2024-03-02 ENCOUNTER — Ambulatory Visit: Attending: Physician Assistant | Admitting: Physician Assistant

## 2024-03-02 ENCOUNTER — Encounter: Payer: Self-pay | Admitting: Physician Assistant

## 2024-03-02 ENCOUNTER — Ambulatory Visit (INDEPENDENT_AMBULATORY_CARE_PROVIDER_SITE_OTHER)

## 2024-03-02 VITALS — BP 124/78 | HR 78 | Ht 72.0 in | Wt 262.0 lb

## 2024-03-02 DIAGNOSIS — E785 Hyperlipidemia, unspecified: Secondary | ICD-10-CM

## 2024-03-02 DIAGNOSIS — I10 Essential (primary) hypertension: Secondary | ICD-10-CM

## 2024-03-02 DIAGNOSIS — I2510 Atherosclerotic heart disease of native coronary artery without angina pectoris: Secondary | ICD-10-CM

## 2024-03-02 DIAGNOSIS — R002 Palpitations: Secondary | ICD-10-CM | POA: Diagnosis not present

## 2024-03-02 MED ORDER — ROSUVASTATIN CALCIUM 20 MG PO TABS: 20.0000 mg | ORAL_TABLET | Freq: Every day | ORAL | 3 refills | Status: DC

## 2024-03-02 MED ORDER — OLMESARTAN MEDOXOMIL 40 MG PO TABS: 40.0000 mg | ORAL_TABLET | Freq: Every day | ORAL | 3 refills | Status: AC

## 2024-03-02 NOTE — Patient Instructions (Addendum)
 Medication Instructions:  Stop Valsartan as directed Start Olmesartan 40 mg daily Amlodipine 10 is to be taken at night Increase Crestor 20 mg daily *If you need a refill on your cardiac medications before your next appointment, please call your pharmacy*   Lab Work: NONE ordered at this time of appointment   Testing/Procedures: Christena Deem- Long Term Monitor Instructions  Your physician has requested you wear a ZIO patch monitor for 14 days.  This is a single patch monitor. [employer] supplies one patch monitor per enrollment. Additional stickers are not available. Please do not apply patch if you will be having a Nuclear Stress Test,  Echocardiogram, Cardiac CT, MRI, or Chest Xray during the period you would be wearing the  monitor. The patch cannot be worn during these tests. You cannot remove and re-apply the  ZIO XT patch monitor.  Your ZIO patch monitor will be mailed 3 day USPS to your address on file. It may take 3-5 days  to receive your monitor after you have been enrolled.  Once you have received your monitor, please review the enclosed instructions. Your monitor  has already been registered assigning a specific monitor serial # to you.  Billing and Patient Assistance Program Information  We have supplied [employer] with any of your insurance information on file for billing purposes. [employer] offers a sliding scale Patient Assistance Program for patients that do not have  insurance, or whose insurance does not completely cover the cost of the ZIO monitor.  You must apply for the Patient Assistance Program to qualify for this discounted rate.  To apply, please call [employer] at [phone], select option 4, select option 2, ask to apply for  Patient Assistance Program. Meredeth Ide will ask your household income, and how many people  are in your household. They will quote your out-of-pocket cost based on that information.  [employer] will also be able to set up a 15-month, interest-free  payment plan if needed.  Applying the monitor   Shave hair from upper left chest.  Hold abrader disc by orange tab. Rub abrader in 40 strokes over the upper left chest as  indicated in your monitor instructions.  Clean area with 4 enclosed alcohol pads. Let dry.  Apply patch as indicated in monitor instructions. Patch will be placed under collarbone on left  side of chest with arrow pointing upward.  Rub patch adhesive wings for 2 minutes. Remove white label marked "1". Remove the white  label marked "2". Rub patch adhesive wings for 2 additional minutes.  While looking in a mirror, press and release button in center of patch. A small green light will  flash 3-4 times. This will be your only indicator that the monitor has been turned on.  Do not shower for the first 24 hours. You may shower after the first 24 hours.  Press the button if you feel a symptom. You will hear a small click. Record Date, Time and  Symptom in the Patient Logbook.  When you are ready to remove the patch, follow instructions on the last 2 pages of Patient  Logbook. Stick patch monitor onto the last page of Patient Logbook.  Place Patient Logbook in the blue and white box. Use locking tab on box and tape box closed  securely. The blue and white box has prepaid postage on it. Please place it in the mailbox as  soon as possible. Your physician should have your test results approximately 7 days after the  monitor has been mailed  back to [employer].  Call [facility] Customer Care at [phone] if you have questions regarding  your ZIO XT patch monitor. Call them immediately if you see an orange light blinking on your  monitor.  If your monitor falls off in less than 4 days, contact our Monitor department at [phone].  If your monitor becomes loose or falls off after 4 days call [employer] at [phone] for  suggestions on securing your monitor     Follow-Up: At [facility], you and your  health needs are our priority.  As part of our continuing mission to provide you with exceptional heart care, we have created designated Provider Care Teams.  These Care Teams include your primary Cardiologist (physician) and Advanced Practice Providers (APPs -  Physician Assistants and Nurse Practitioners) who all work together to provide you with the care you need, when you need it.  We recommend signing up for the patient portal called "MyChart".  Sign up information is provided on this After Visit Summary.  MyChart is used to connect with patients for Virtual Visits (Telemedicine).  Patients are able to view lab/test results, encounter notes, upcoming appointments, etc.  Non-urgent messages can be sent to your provider as well.   To learn more about what you can do with MyChart, go to [email or web address].    Your next appointment:   1 year(s)  Provider:   Rollene Rotunda, MD  or Micah Flesher, PA-C        Other Instructions   1st Floor: - Lobby - Registration  - Pharmacy  - Lab - Cafe  2nd Floor: - PV Lab - Diagnostic Testing (echo, CT, nuclear med)  3rd Floor: - Vacant  4th Floor: - TCTS (cardiothoracic surgery) - AFib Clinic - Structural Heart Clinic - Vascular Surgery  - Vascular Ultrasound  5th Floor: - HeartCare Cardiology (general and EP) - Clinical Pharmacy for coumadin, hypertension, lipid, weight-loss medications, and med management appointments    Valet parking services will be available as well.

## 2024-03-02 NOTE — Progress Notes (Unsigned)
Enrolled patient for a 14 day Zio XT monitor to be mailed to patients home  [place] to read

## 2024-04-03 DIAGNOSIS — R002 Palpitations: Secondary | ICD-10-CM

## 2024-11-07 ENCOUNTER — Other Ambulatory Visit: Payer: Self-pay | Admitting: Physician Assistant

## 2025-01-09 ENCOUNTER — Other Ambulatory Visit: Payer: Self-pay

## 2025-01-12 MED ORDER — ROSUVASTATIN CALCIUM 20 MG PO TABS: 20.0000 mg | ORAL_TABLET | Freq: Every day | ORAL | 0 refills | Status: AC

## 2025-01-12 NOTE — Telephone Encounter (Signed)
 Refill sent
